# Patient Record
Sex: Male | Born: 1960 | Race: Black or African American | Hispanic: No | Smoking: Current every day smoker
Health system: Southern US, Community
[De-identification: ages and names within clinical notes are randomized; demographics above are authoritative.]

## PROBLEM LIST (undated history)

## (undated) DIAGNOSIS — M4807 Spinal stenosis, lumbosacral region: Secondary | ICD-10-CM

## (undated) DIAGNOSIS — M5430 Sciatica, unspecified side: Secondary | ICD-10-CM

## (undated) HISTORY — PX: OTHER SURGICAL HISTORY: SHX169

---

## 2013-04-20 ENCOUNTER — Emergency Department (HOSPITAL_COMMUNITY): Payer: Non-veteran care

## 2013-04-20 ENCOUNTER — Emergency Department (HOSPITAL_COMMUNITY)
Admission: EM | Admit: 2013-04-20 | Discharge: 2013-04-20 | Disposition: A | Discharge status: Home / Self Care | Payer: Non-veteran care | Attending: Emergency Medicine | Admitting: Emergency Medicine

## 2013-04-20 ENCOUNTER — Encounter (HOSPITAL_COMMUNITY): Payer: Self-pay | Admitting: Emergency Medicine

## 2013-04-20 DIAGNOSIS — M546 Pain in thoracic spine: Secondary | ICD-10-CM | POA: Insufficient documentation

## 2013-04-20 DIAGNOSIS — R071 Chest pain on breathing: Secondary | ICD-10-CM | POA: Insufficient documentation

## 2013-04-20 DIAGNOSIS — G8929 Other chronic pain: Secondary | ICD-10-CM | POA: Insufficient documentation

## 2013-04-20 DIAGNOSIS — F17200 Nicotine dependence, unspecified, uncomplicated: Secondary | ICD-10-CM | POA: Insufficient documentation

## 2013-04-20 HISTORY — DX: Sciatica, unspecified side: M54.30

## 2013-04-20 HISTORY — DX: Spinal stenosis, lumbosacral region: M48.07

## 2013-04-20 LAB — TROPONIN I: Troponin I: <0.3 ng/mL (ref <0.30)

## 2013-04-20 LAB — CBC
MCH: 33.7 pg (ref 26.0–34.0)
MCV: 96.9 fL (ref 78.0–100.0)
Platelets: 207 10*3/uL (ref 150–400)
RBC: 4.81 MIL/uL (ref 4.22–5.81)
RDW: 13.1 % (ref 11.5–15.5)

## 2013-04-20 MED ORDER — OXYCODONE-ACETAMINOPHEN 5-325 MG PO TABS: 1.0000 | ORAL_TABLET | ORAL | Status: DC | PRN

## 2013-04-20 MED ORDER — HYDROMORPHONE HCL PF 1 MG/ML IJ SOLN
1.0000 mg | Freq: Once | INTRAMUSCULAR | Status: AC
  Administered 2013-04-20: 1 mg via INTRAMUSCULAR
  Filled 2013-04-20: qty 1

## 2013-04-20 MED ORDER — CYCLOBENZAPRINE HCL 5 MG PO TABS: 5.0000 mg | ORAL_TABLET | Freq: Three times a day (TID) | ORAL | Status: DC | PRN

## 2013-04-20 MED ORDER — PREDNISONE 10 MG PO TABS: ORAL_TABLET | ORAL | Status: DC

## 2013-04-20 NOTE — ED Provider Notes (Signed)
CSN: [id number]     Arrival date & time 04/20/13  1204 History   First MD Initiated Contact with Patient 04/20/13 1343     Chief Complaint  Patient presents with  . Pleurisy   (Consider location/radiation/quality/duration/timing/severity/associated sxs/prior Treatment) HPI Comments: Ricardo Horn is a 52 y.o. Male presenting with left sided chest and back pain which is a chronic intermittent problem he has had for the past 4 years which is states is related to cervical and upper back "pinched nerve".  He is usually followed for this by his pcp at the [facility] in [place] and has been assessed for possible surgery, but was determined that surgery would not improve his symptoms.  Pain is worse with movement, palpation and deep inspiration, although he denies being short of breath. Additionally he has not had fevers or chills, denies cough, abdominal pain, nausea , vomiting diaphoresis.  He also denies swelling or pain in his extremities. He denies injury.     The history is provided by the patient.    Past Medical History  Diagnosis Date  . Stenosis of lumbosacral spine   . Sciatica    History reviewed. No pertinent past surgical history. No family history on file. History  Substance Use Topics  . Smoking status: Current Every Day Smoker  . Smokeless tobacco: Not on file  . Alcohol Use: No    Review of Systems  Constitutional: Negative for fever, chills and diaphoresis.  HENT: Negative for congestion and sore throat.   Eyes: Negative.   Respiratory: Negative for cough, choking, chest tightness, shortness of breath, wheezing and stridor.   Cardiovascular: Positive for chest pain. Negative for palpitations and leg swelling.  Gastrointestinal: Negative for nausea, vomiting and abdominal pain.  Genitourinary: Negative.   Musculoskeletal: Negative for arthralgias, joint swelling and neck pain.  Skin: Negative.  Negative for rash and wound.  Neurological: Negative for dizziness, weakness,  light-headedness, numbness and headaches.  Psychiatric/Behavioral: Negative.     Allergies  Morphine and related  Home Medications  No current outpatient prescriptions on file. BP 127/78  Pulse 74  Temp(Src) 97.6 F (36.4 C) (Oral)  Resp 20  Ht 6\' 1"  (1.854 m)  Wt 189 lb (85.73 kg)  BMI 24.94 kg/m2  SpO2 99% Physical Exam  Nursing note and vitals reviewed. Constitutional: He appears well-developed and well-nourished.  Appears uncomfortable.  HENT:  Head: Normocephalic and atraumatic.  Eyes: Conjunctivae are normal.  Neck: Normal range of motion.  Cardiovascular: Normal rate, regular rhythm, normal heart sounds and intact distal pulses.   Pulmonary/Chest: Effort normal and breath sounds normal. He has no wheezes. He has no rhonchi. He exhibits tenderness.  Pain is reproducible with palpation of his left lateral chest from mid axillary line to his left posterior upper back.  No muscle spasm noted, no rash,  Swelling, contusion or other sign of trauma.  Abdominal: Soft. Bowel sounds are normal. There is no tenderness.  Musculoskeletal: Normal range of motion. He exhibits no edema and no tenderness.  Neurological: He is alert.  Skin: Skin is warm and dry.  Psychiatric: He has a normal mood and affect.    ED Course  Procedures (including critical care time) Labs Review Labs Reviewed - No data to display Imaging Review Dg Chest 2 View  04/20/2013   CLINICAL DATA:  Severe left-sided chest discomfort for 2 days.  EXAM: CHEST  2 VIEW  COMPARISON:  February 24, 2013.  FINDINGS: The lungs are well-expanded. There is no pneumothorax or pneumomediastinum  nor pleural effusion. There is no alveolar infiltrate. The cardiac silhouette is normal in size. The pulmonary vascularity is not engorged. The mediastinum is normal in width. The observed portions of the bony thorax exhibit no acute abnormalities.  IMPRESSION: There is no evidence of acute cardiopulmonary abnormality. Given the  patient's symptoms, follow-up CT scanning of the chest may be of value.   Electronically Signed   By: [name]   On: 04/20/2013 12:41    EKG Interpretation     Ventricular Rate:  69 PR Interval:  142 QRS Duration: 84 QT Interval:  382 QTC Calculation: 409 R Axis:   34 Text Interpretation:  Normal sinus rhythm Normal ECG No previous ECGs available            MDM  No diagnosis found. Patients labs and/or radiological studies were viewed and considered during the medical decision making and disposition process. Pt was discussed with Dr Adriana Simas prior to dc home.  He was prescribed percocet,  Prednisone taper,  Flexeril.  Encouraged heat therapy.  F/u with pcp at the [facility].  Pt with negative troponin for sx that have been present for 48 + hours,  Normal VS,  Exam not consistent with increased risk for PE.  Reproducible chest wall and back pain.    Burgess Amor, PA-C 04/20/13 1550

## 2013-04-20 NOTE — ED Notes (Signed)
Pt c/o left side chest pain that is worse with palpation, movement, denies any injury,

## 2013-04-20 NOTE — ED Provider Notes (Signed)
Medical screening examination/treatment/procedure(s) were conducted as a shared visit with non-physician practitioner(s) and myself.  I personally evaluated the patient during the encounter.  EKG Interpretation     Ventricular Rate:  69 PR Interval:  142 QRS Duration: 84 QT Interval:  382 QTC Calculation: 409 R Axis:   34 Text Interpretation:  Normal sinus rhythm Normal ECG No previous ECGs available           Chest pain not suggestive of acute coronary syndrome or pulmonary embolism. EKG normal  Donnetta Hutching, MD 04/20/13 [phone]

## 2013-04-20 NOTE — ED Notes (Signed)
Patient with no complaints at this time. Respirations even and unlabored. Skin warm/dry. Discharge instructions reviewed with patient at this time. Patient given opportunity to voice concerns/ask questions. Patient discharged at this time and left Emergency Department with steady gait.   

## 2013-11-29 ENCOUNTER — Emergency Department (HOSPITAL_COMMUNITY)
Admission: EM | Admit: 2013-11-29 | Discharge: 2013-11-29 | Disposition: A | Discharge status: Home / Self Care | Payer: Non-veteran care | Attending: Emergency Medicine | Admitting: Emergency Medicine

## 2013-11-29 ENCOUNTER — Encounter (HOSPITAL_COMMUNITY): Payer: Self-pay | Admitting: Emergency Medicine

## 2013-11-29 ENCOUNTER — Emergency Department (HOSPITAL_COMMUNITY): Payer: Non-veteran care

## 2013-11-29 DIAGNOSIS — M25559 Pain in unspecified hip: Secondary | ICD-10-CM | POA: Insufficient documentation

## 2013-11-29 DIAGNOSIS — Z8739 Personal history of other diseases of the musculoskeletal system and connective tissue: Secondary | ICD-10-CM | POA: Insufficient documentation

## 2013-11-29 DIAGNOSIS — M545 Low back pain, unspecified: Secondary | ICD-10-CM | POA: Insufficient documentation

## 2013-11-29 DIAGNOSIS — F17200 Nicotine dependence, unspecified, uncomplicated: Secondary | ICD-10-CM | POA: Insufficient documentation

## 2013-11-29 DIAGNOSIS — IMO0002 Reserved for concepts with insufficient information to code with codable children: Secondary | ICD-10-CM | POA: Insufficient documentation

## 2013-11-29 DIAGNOSIS — M25552 Pain in left hip: Secondary | ICD-10-CM

## 2013-11-29 DIAGNOSIS — Z79899 Other long term (current) drug therapy: Secondary | ICD-10-CM | POA: Insufficient documentation

## 2013-11-29 LAB — CBC WITH DIFFERENTIAL/PLATELET
Basophils Absolute: 0 10*3/uL (ref 0.0–0.1)
Basophils Relative: 0 % (ref 0–1)
EOS ABS: 0.1 10*3/uL (ref 0.0–0.7)
Eosinophils Relative: 1 % (ref 0–5)
HCT: 44.7 % (ref 39.0–52.0)
HEMOGLOBIN: 15.7 g/dL (ref 13.0–17.0)
Lymphocytes Relative: 22 % (ref 12–46)
Lymphs Abs: 1.8 10*3/uL (ref 0.7–4.0)
MCH: 33.9 pg (ref 26.0–34.0)
MCHC: 35.1 g/dL (ref 30.0–36.0)
MCV: 96.5 fL (ref 78.0–100.0)
MONO ABS: 0.5 10*3/uL (ref 0.1–1.0)
MONOS PCT: 6 % (ref 3–12)
Neutro Abs: 6 10*3/uL (ref 1.7–7.7)
Neutrophils Relative %: 71 % (ref 43–77)
Platelets: 185 10*3/uL (ref 150–400)
RBC: 4.63 MIL/uL (ref 4.22–5.81)
RDW: 13 % (ref 11.5–15.5)
WBC: 8.4 10*3/uL (ref 4.0–10.5)

## 2013-11-29 LAB — URINALYSIS, ROUTINE W REFLEX MICROSCOPIC
BILIRUBIN URINE: NEGATIVE
Glucose, UA: NEGATIVE mg/dL
HGB URINE DIPSTICK: NEGATIVE
Ketones, ur: NEGATIVE mg/dL
Leukocytes, UA: NEGATIVE
Nitrite: NEGATIVE
PROTEIN: NEGATIVE mg/dL
Specific Gravity, Urine: <1.005 — ABNORMAL LOW (ref 1.005–1.030)
UROBILINOGEN UA: 1 mg/dL (ref 0.0–1.0)
pH: 6 (ref 5.0–8.0)

## 2013-11-29 LAB — BASIC METABOLIC PANEL
BUN: 7 mg/dL (ref 6–23)
CO2: 28 mEq/L (ref 19–32)
Calcium: 9.4 mg/dL (ref 8.4–10.5)
Chloride: 102 mEq/L (ref 96–112)
Creatinine, Ser: 1.1 mg/dL (ref 0.50–1.35)
GFR calc Af Amer: 86 mL/min — ABNORMAL LOW (ref >90)
GFR, EST NON AFRICAN AMERICAN: 74 mL/min — AB (ref >90)
GLUCOSE: 91 mg/dL (ref 70–99)
Potassium: 3.8 mEq/L (ref 3.7–5.3)
Sodium: 141 mEq/L (ref 137–147)

## 2013-11-29 MED ORDER — CYCLOBENZAPRINE HCL 10 MG PO TABS: 10.0000 mg | ORAL_TABLET | Freq: Three times a day (TID) | ORAL | Status: DC | PRN

## 2013-11-29 MED ORDER — OXYCODONE-ACETAMINOPHEN 5-325 MG PO TABS: 1.0000 | ORAL_TABLET | ORAL | Status: DC | PRN

## 2013-11-29 MED ORDER — NAPROXEN 500 MG PO TABS: 500.0000 mg | ORAL_TABLET | Freq: Two times a day (BID) | ORAL | Status: DC

## 2013-11-29 MED ORDER — HYDROMORPHONE HCL PF 1 MG/ML IJ SOLN
1.0000 mg | Freq: Once | INTRAMUSCULAR | Status: AC
  Administered 2013-11-29: 1 mg via INTRAMUSCULAR
  Filled 2013-11-29: qty 1

## 2013-11-29 MED ORDER — CYCLOBENZAPRINE HCL 10 MG PO TABS
10.0000 mg | ORAL_TABLET | Freq: Once | ORAL | Status: AC
  Administered 2013-11-29: 10 mg via ORAL
  Filled 2013-11-29: qty 1

## 2013-11-29 MED ORDER — ONDANSETRON 8 MG PO TBDP
8.0000 mg | ORAL_TABLET | Freq: Once | ORAL | Status: AC
  Administered 2013-11-29: 8 mg via ORAL
  Filled 2013-11-29: qty 1

## 2013-11-29 NOTE — Discharge Instructions (Signed)
Hip Pain °The hips join the upper legs to the lower pelvis. The bones, cartilage, tendons, and muscles of the hip joint perform a lot of work each day holding your body weight and allowing you to move around. °Hip pain is a common symptom. It can range from a minor ache to severe pain on 1 or both hips. Pain may be felt on the inside of the hip joint near the groin, or the outside near the buttocks and upper thigh. There may be swelling or stiffness as well. It occurs more often when a person walks or performs activity. There are many reasons hip pain can develop. °CAUSES  °It is important to work with your caregiver to identify the cause since many conditions can impact the bones, cartilage, muscles, and tendons of the hips. Causes for hip pain include: °· Broken (fractured) bones. °· Separation of the thighbone from the hip socket (dislocation). °· Torn cartilage of the hip joint. °· Swelling (inflammation) of a tendon (tendonitis), the sac within the hip joint (bursitis), or a joint. °· A weakening in the abdominal wall (hernia), affecting the nerves to the hip. °· Arthritis in the hip joint or lining of the hip joint. °· Pinched nerves in the back, hip, or upper thigh. °· A bulging disc in the spine (herniated disc). °· Rarely, bone infection or cancer. °DIAGNOSIS  °The location of your hip pain will help your caregiver understand what may be causing the pain. A diagnosis is based on your medical history, your symptoms, results from your physical exam, and results from diagnostic tests. Diagnostic tests may include X-ray exams, a computerized magnetic scan (magnetic resonance imaging, MRI), or bone scan. °TREATMENT  °Treatment will depend on the cause of your hip pain. Treatment may include: °· Limiting activities and resting until symptoms improve. °· Crutches or other walking supports (a cane or brace). °· Ice, elevation, and compression. °· Physical therapy or home exercises. °· Shoe inserts or special  shoes. °· Losing weight. °· Medications to reduce pain. °· Undergoing surgery. °HOME CARE INSTRUCTIONS  °· Only take over-the-counter or prescription medicines for pain, discomfort, or fever as directed by your caregiver. °· Put ice on the injured area: °¨ Put ice in a plastic bag. °¨ Place a towel between your skin and the bag. °¨ Leave the ice on for 15-20 minutes at a time, 03-04 times a day. °· Keep your leg raised (elevated) when possible to lessen swelling. °· Avoid activities that cause pain. °· Follow specific exercises as directed by your caregiver. °· Sleep with a pillow between your legs on your most comfortable side. °· Record how often you have hip pain, the location of the pain, and what it feels like. This information may be helpful to you and your caregiver. °· Ask your caregiver about returning to work or sports and whether you should drive. °· Follow up with your caregiver for further exams, therapy, or testing as directed. °SEEK MEDICAL CARE IF:  °· Your pain or swelling continues or worsens after 1 week. °· You are feeling unwell or have chills. °· You have increasing difficulty with walking. °· You have a loss of sensation or other new symptoms. °· You have questions or concerns. °SEEK IMMEDIATE MEDICAL CARE IF:  °· You cannot put weight on the affected hip. °· You have fallen. °· You have a sudden increase in pain and swelling in your hip. °· You have a fever. °MAKE SURE YOU:  °· Understand these instructions. °·   Will watch your condition. °· Will get help right away if you are not doing well or get worse. °Document Released: 11/14/2009 Document Revised: 08/19/2011 Document Reviewed: 11/14/2009 °ExitCare® Patient Information ©2015 [employer]. This information is not intended to replace advice given to you by your health care provider. Make sure you discuss any questions you have with your health care provider. ° °

## 2013-11-29 NOTE — ED Notes (Signed)
Patient states that he has chronic back pain but for the past 2 days has been having pain to left hip. Patient denies falling

## 2013-11-29 NOTE — ED Provider Notes (Signed)
CSN: [id number]     Arrival date & time 11/29/13  1905 History  This chart was scribed for Pauline Ausammy Triplett, PA, working with Vanetta MuldersScott Zackowski, MD by Chestine SporeSoijett Blue, ED Scribe. The patient was seen in room APFT24/APFT24 at 7:39 PM.     Chief Complaint  Patient presents with  . Hip Pain     The history is provided by the patient. No language interpreter was used.    HPI Comments: Ricardo Horn is a 53 y.o. male who presents to the Emergency Department complaining of sharp intense hip pain onset 3 days ago. He states that he tried to get up 2 days ago and he had to roll up. He states that he has not done anything out of the normal. He states that the pain is exacerbated with movement and standing. He states that when he uses the bathroom his hip will "lock up". He states that he has a shooting pain down his left leg for the past couple of days.  He states that he is not having any issues with urinary or bowel incontinence. He states that he has never hurt this hip before, but does have a hx of chronic lower back pain which he takes vicodin for. He denies vomiting and fever, abd pain, numbness or weakness of the extremities.  He states that he had an epidural 3 weeks ago in his back [facility]  Past Medical History  Diagnosis Date  . Stenosis of lumbosacral spine   . Sciatica    History reviewed. No pertinent past surgical history. History reviewed. No pertinent family history. History  Substance Use Topics  . Smoking status: Current Every Day Smoker -- 0.50 packs/day  . Smokeless tobacco: Not on file  . Alcohol Use: No    Review of Systems  Constitutional: Negative for appetite change and fatigue.  HENT: Negative for congestion, ear discharge and sinus pressure.   Eyes: Negative for discharge.  Respiratory: Negative for cough.   Cardiovascular: Negative for chest pain.  Gastrointestinal: Negative for abdominal pain and diarrhea.  Genitourinary: Negative for  frequency and hematuria.  Musculoskeletal: Positive for back pain (radiating to the lateral left hip).  Skin: Negative for rash.  Neurological: Negative for seizures and headaches.  Psychiatric/Behavioral: Negative for hallucinations.      Allergies  Morphine and related  Home Medications   Prior to Admission medications   Medication Sig Start Date End Date Taking? Authorizing Provider  cyclobenzaprine (FLEXERIL) 5 MG tablet Take 1 tablet (5 mg total) by mouth 3 (three) times daily as needed for muscle spasms. 04/20/13   Burgess AmorJulie Idol, PA-C  oxyCODONE-acetaminophen (PERCOCET/ROXICET) 5-325 MG per tablet Take 1 tablet by mouth every 4 (four) hours as needed for severe pain. 04/20/13   Burgess AmorJulie Idol, PA-C  predniSONE (DELTASONE) 10 MG tablet 6, 5, 4, 3, 2 then 1 tablet by mouth daily for 6 days total. 04/20/13   Burgess AmorJulie Idol, PA-C   BP 166/94  Pulse 89  Temp(Src) 97.3 F (36.3 C) (Oral)  Resp 20  Ht 6\' 1"  (1.854 m)  Wt 189 lb (85.73 kg)  BMI 24.94 kg/m2  SpO2 98%  Physical Exam  Nursing note and vitals reviewed. Constitutional: He is oriented to person, place, and time. He appears well-developed.  HENT:  Head: Normocephalic and atraumatic.  Eyes: Conjunctivae and EOM are normal. No scleral icterus.  Neck: Neck supple. No thyromegaly present.  Cardiovascular: Normal rate, regular rhythm, normal heart sounds and intact  distal pulses.  Exam reveals no gallop and no friction rub.   No murmur heard. Pulmonary/Chest: Effort normal and breath sounds normal. No stridor. No respiratory distress. He has no wheezes. He has no rales. He exhibits no tenderness.  Abdominal: Soft. He exhibits no distension. There is no tenderness. There is no rebound.  Musculoskeletal: Normal range of motion. He exhibits no edema.       Lumbar back: He exhibits tenderness, bony tenderness, pain and spasm. He exhibits no swelling, no deformity and no laceration.       Back:  Localized tenderness to palpitation of  the left lumbar spine, paraspinal muscles and SI joint. Pt describes radiating pain to the lateral left hip. Pain is reproduced with internal and external rotation of the hip.  No erythema or edema. 5/5 strength against resistance bilateral LE. dp pulses are brisk. No calf tenderness.   Lymphadenopathy:    He has no cervical adenopathy.  Neurological: He is alert and oriented to person, place, and time. He exhibits normal muscle tone. Coordination normal.  Skin: No rash noted. No erythema.  Psychiatric: He has a normal mood and affect. His behavior is normal.    ED Course  Procedures (including critical care time) DIAGNOSTIC STUDIES: Oxygen Saturation is 98% on room air, normal by my interpretation.    COORDINATION OF CARE: 7:44 PM-Discussed treatment plan which includes Dilaudid, Zofran, Hip X-ray, and labs with pt at bedside and pt agreed to plan.   Labs Review Labs Reviewed  BASIC METABOLIC PANEL - Abnormal; Notable for the following:    GFR calc non Af Amer 74 (*)    GFR calc Af Amer 86 (*)    All other components within normal limits  URINALYSIS, ROUTINE W REFLEX MICROSCOPIC - Abnormal; Notable for the following:    Specific Gravity, Urine <1.005 (*)    All other components within normal limits  CBC WITH DIFFERENTIAL    Imaging Review Dg Hip Complete Left  11/29/2013   CLINICAL DATA:  Left hip pain.  EXAM: LEFT HIP - COMPLETE 2+ VIEW  COMPARISON:  None.  FINDINGS: There is severe osteoarthritis of the left hip with prominent marginal osteophytes on the acetabulum and femoral head with joint space loss. There is an irregular calcification in the left femoral neck which probably represents a benign bone island.  No acute osseous abnormality.  IMPRESSION: Severe arthritic changes of the left hip.   Electronically Signed   By: Geanie CooleyJim  Maxwell M.D.   On: 11/29/2013 20:40    EKG Interpretation None      MDM   Final diagnoses:  Hip pain, acute, left    Discussed hip film results  with patient and advised him to schedule f/u with orthopedics, referral info given.  Pt agrees to short course of percocet, flexeril and naprosyn for pain.  He agrees to d/c his vicodin while taking the percocet.    He is feeling better after medication and appears stable for d/c.     I personally performed the services described in this documentation, which was scribed in my presence. The recorded information has been reviewed and is accurate.    Tammy L. Trisha Mangleriplett, PA-C 12/02/13 1319

## 2013-12-02 NOTE — ED Provider Notes (Signed)
Medical screening examination/treatment/procedure(s) were performed by non-physician practitioner and as supervising physician I was immediately available for consultation/collaboration.   EKG Interpretation None        Scott Zackowski, MD 12/02/13 1559 

## 2015-07-14 IMAGING — CR DG HIP (WITH OR WITHOUT PELVIS) 2-3V*L*
3 series · 3 of 3 positions shown · non-contrast
Comparison: None.

CLINICAL DATA: Left hip pain.

EXAM:
LEFT HIP - COMPLETE 2+ VIEW

[view not recorded (1 of 3)]
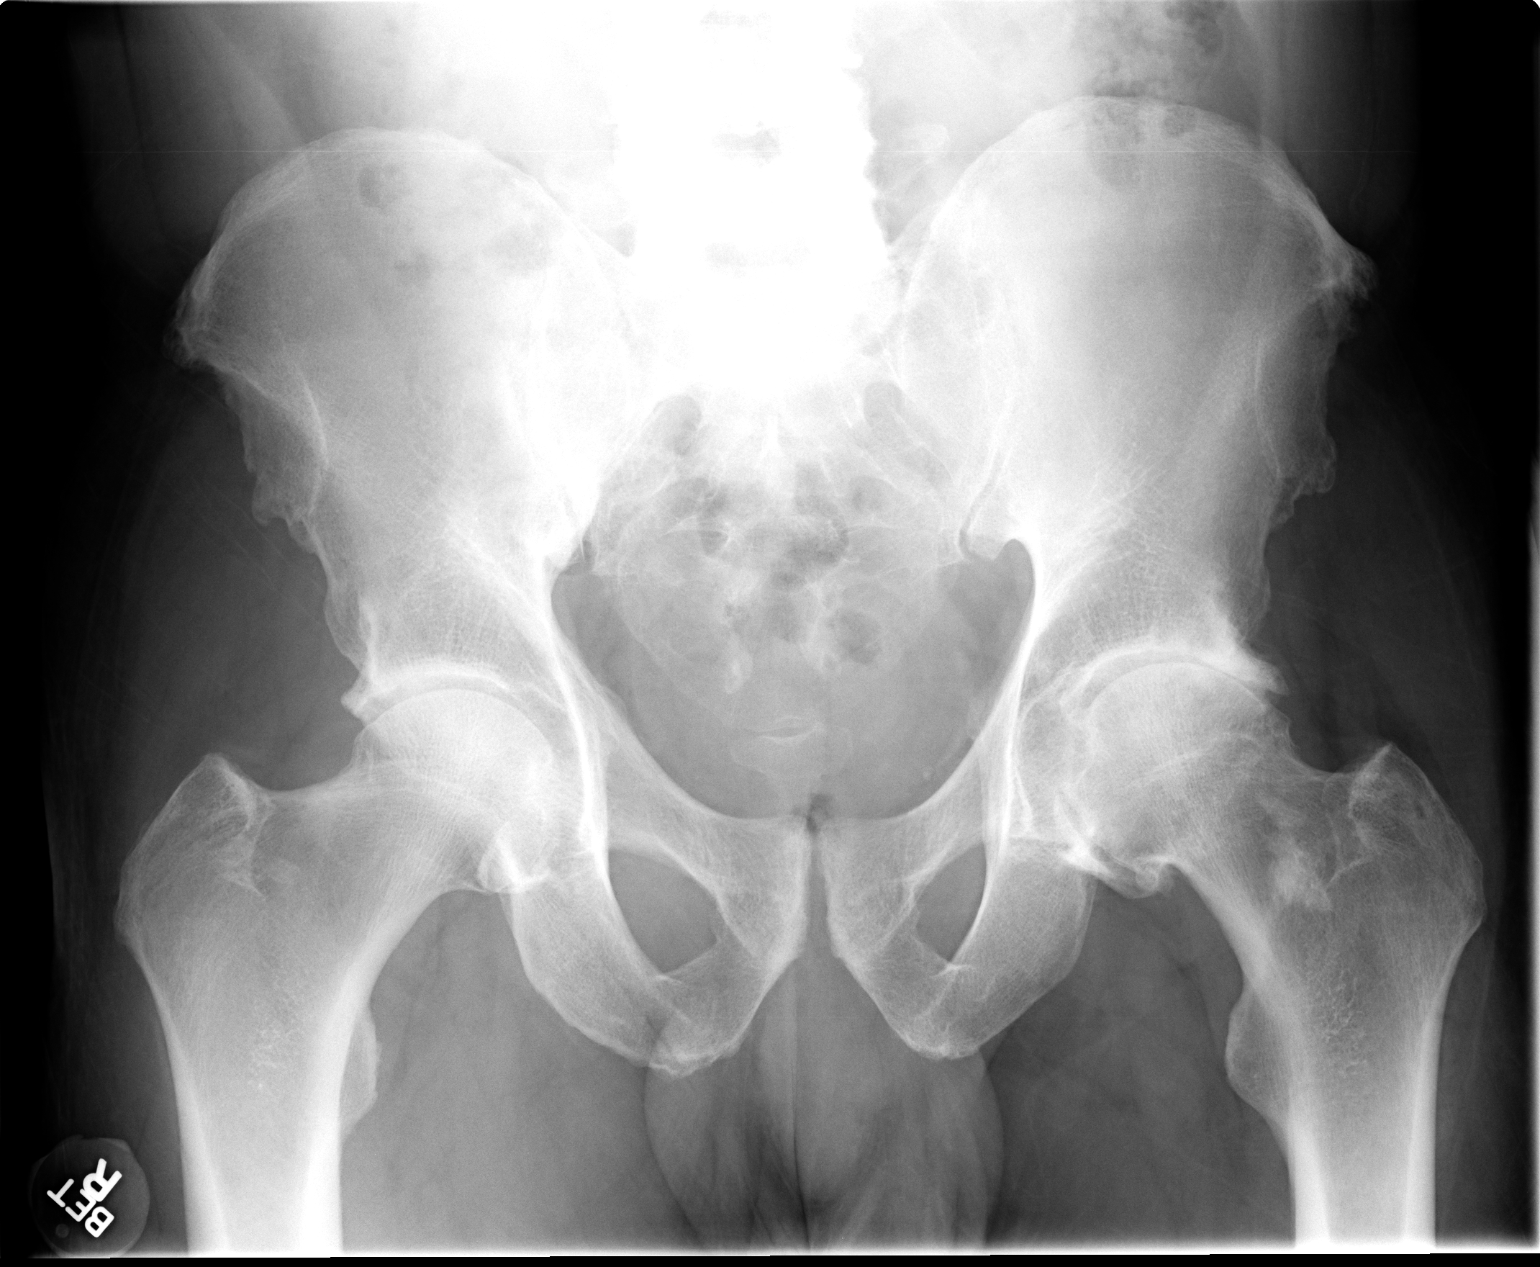

[view not recorded (2 of 3)]
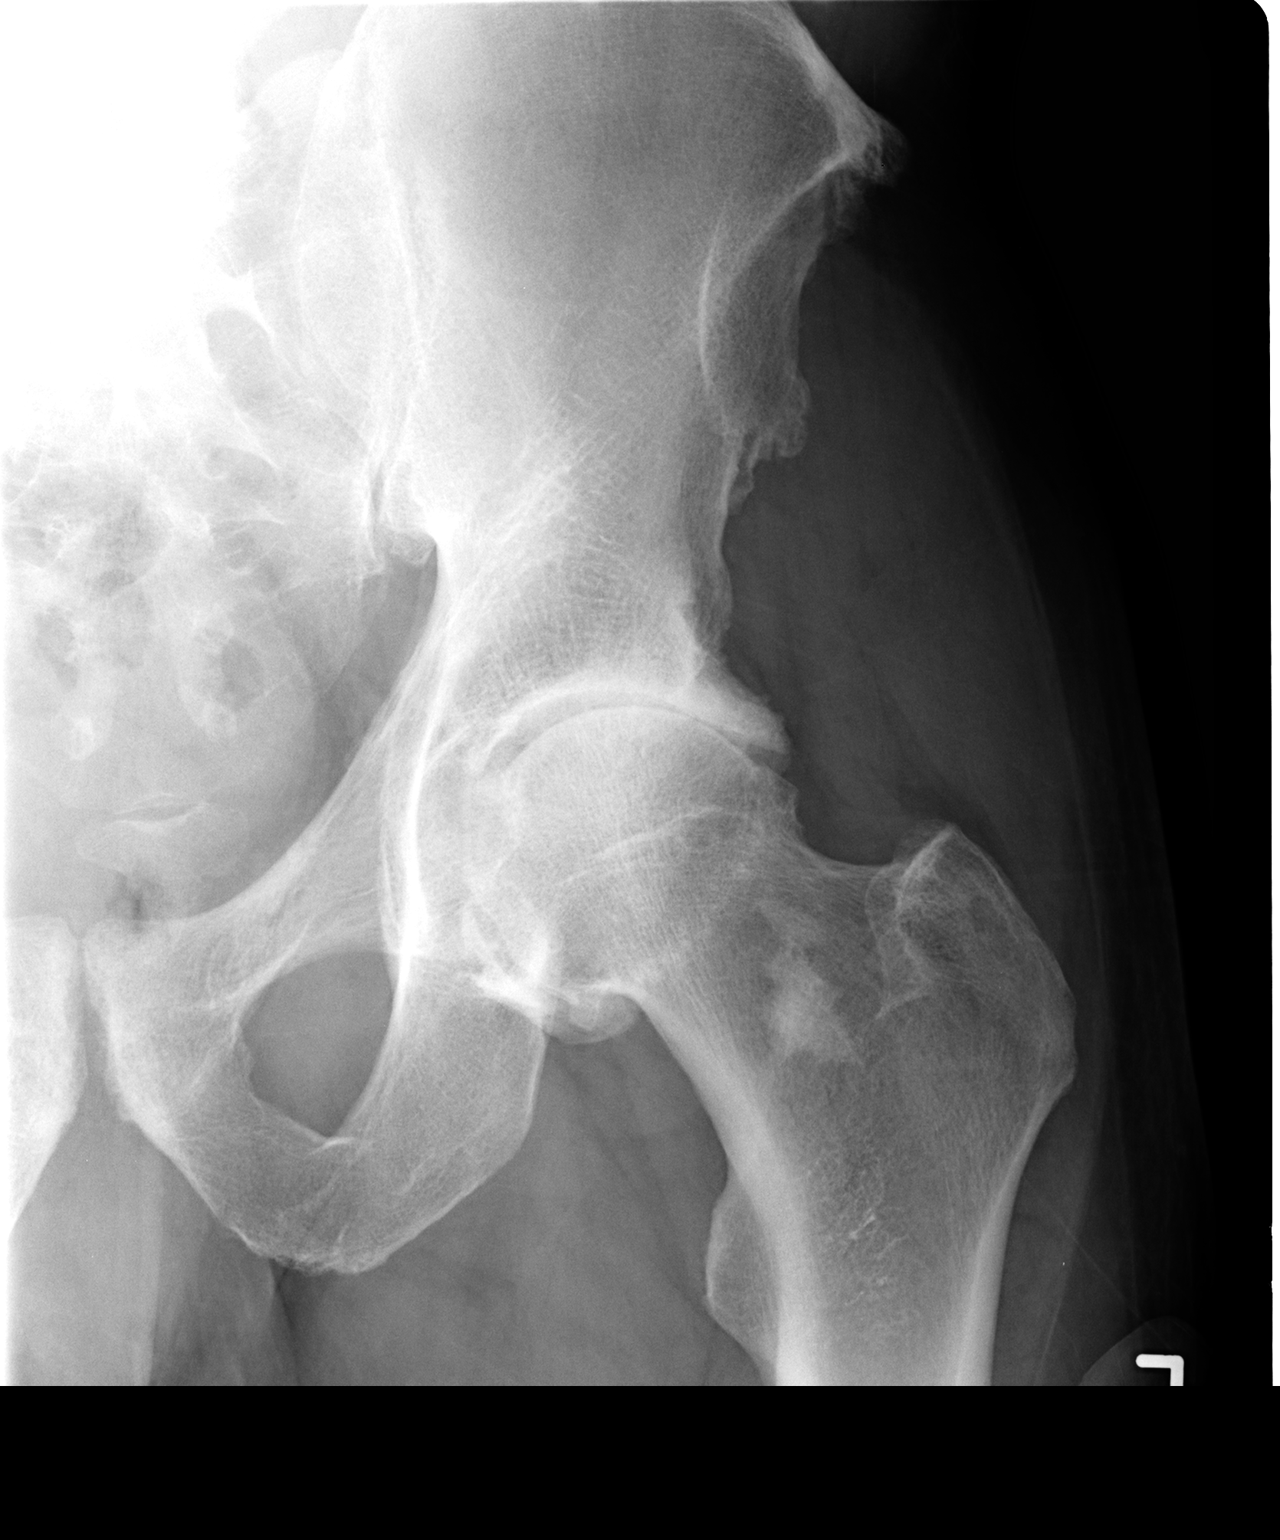

[view not recorded (3 of 3)]
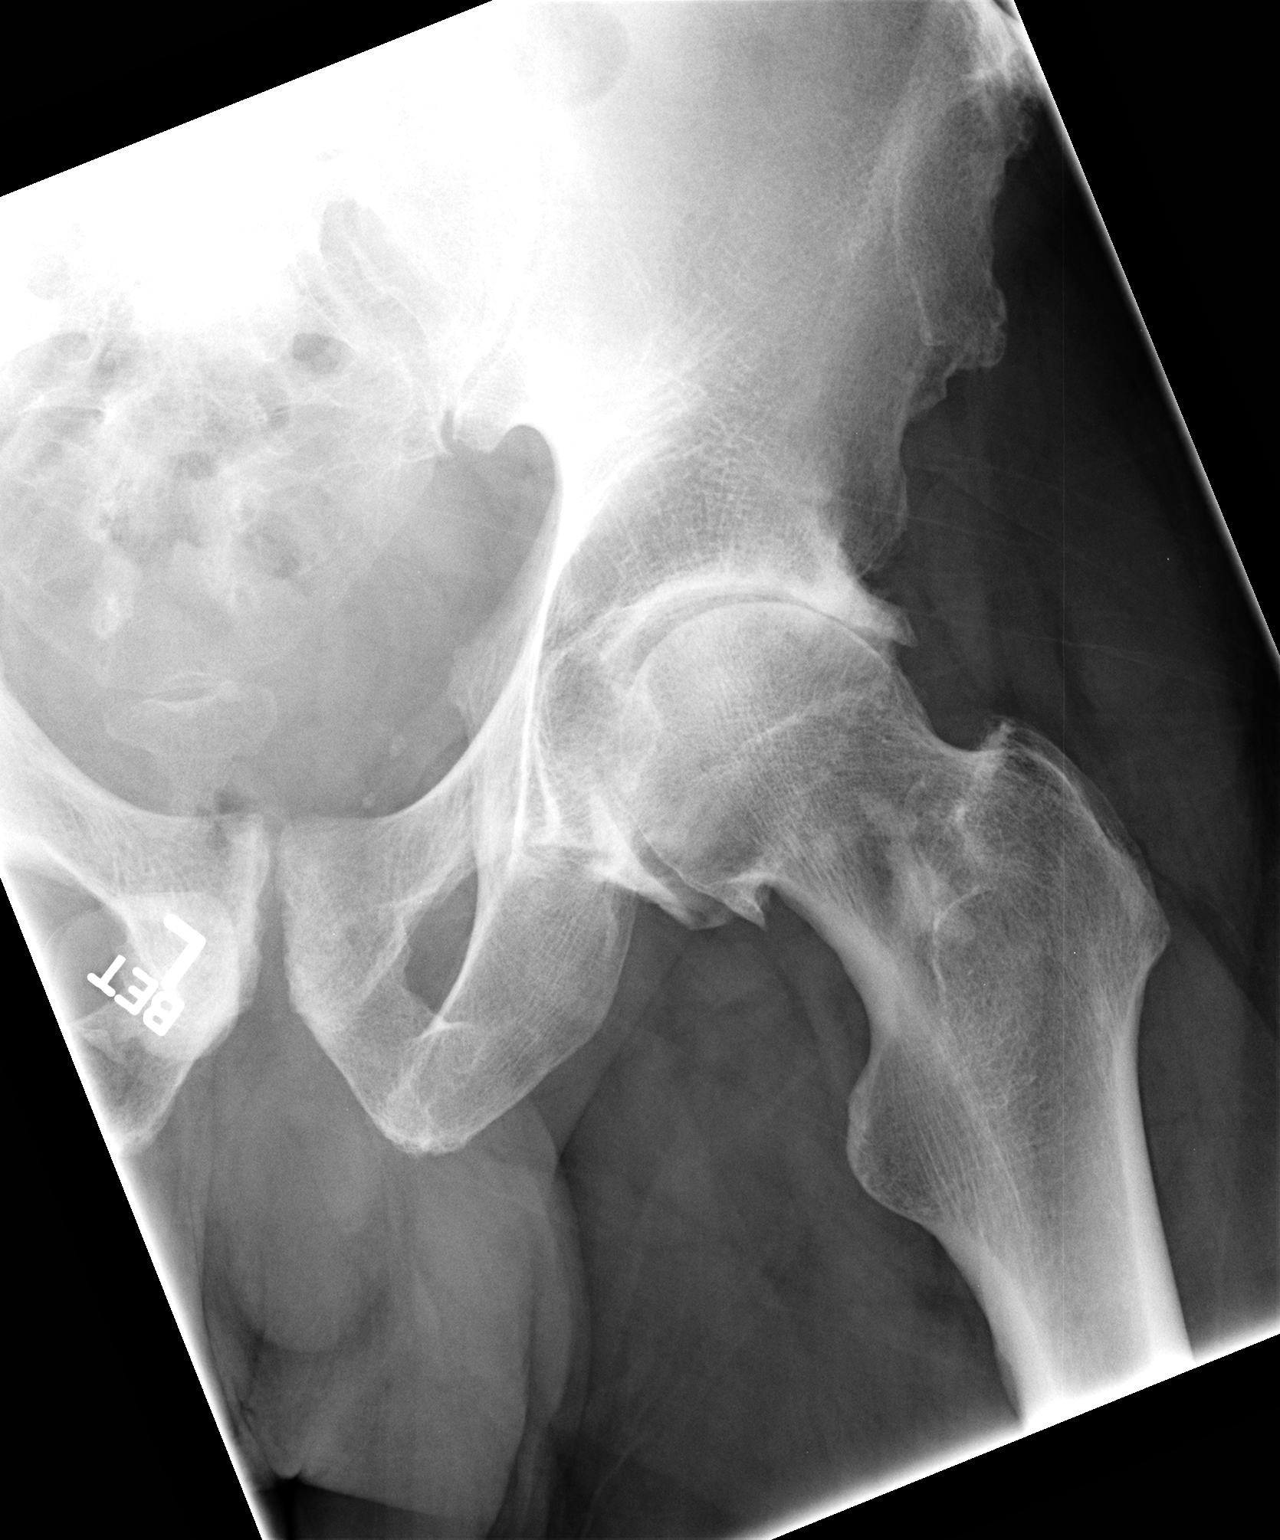

[3 of 3 positions shown; findings below may reference images not displayed]

FINDINGS: There is severe osteoarthritis of the left hip with prominent
marginal osteophytes on the acetabulum and femoral head with joint
space loss. There is an irregular calcification in the left femoral
neck which probably represents a benign bone island.

No acute osseous abnormality.
IMPRESSION: Severe arthritic changes of the left hip.

## 2017-05-20 ENCOUNTER — Emergency Department (HOSPITAL_COMMUNITY)
Admission: EM | Admit: 2017-05-20 | Discharge: 2017-05-20 | Disposition: A | Discharge status: Home / Self Care | Payer: Non-veteran care | Attending: Emergency Medicine | Admitting: Emergency Medicine

## 2017-05-20 ENCOUNTER — Other Ambulatory Visit: Payer: Self-pay

## 2017-05-20 ENCOUNTER — Emergency Department (HOSPITAL_COMMUNITY): Payer: Non-veteran care

## 2017-05-20 ENCOUNTER — Encounter (HOSPITAL_COMMUNITY): Payer: Self-pay | Admitting: Emergency Medicine

## 2017-05-20 DIAGNOSIS — F17210 Nicotine dependence, cigarettes, uncomplicated: Secondary | ICD-10-CM | POA: Insufficient documentation

## 2017-05-20 DIAGNOSIS — Z96649 Presence of unspecified artificial hip joint: Secondary | ICD-10-CM | POA: Insufficient documentation

## 2017-05-20 DIAGNOSIS — M25511 Pain in right shoulder: Secondary | ICD-10-CM | POA: Diagnosis not present

## 2017-05-20 MED ORDER — IBUPROFEN 600 MG PO TABS: 600.0000 mg | ORAL_TABLET | Freq: Four times a day (QID) | ORAL | 0 refills | Status: AC | PRN

## 2017-05-20 MED ORDER — HYDROCODONE-ACETAMINOPHEN 5-325 MG PO TABS: 2.0000 | ORAL_TABLET | ORAL | 0 refills | Status: AC | PRN

## 2017-05-20 NOTE — Discharge Instructions (Signed)
[facility]

## 2017-05-20 NOTE — ED Provider Notes (Signed)
[facility] EMERGENCY DEPARTMENT Provider Note   CSN: [id number] Arrival date & time: 05/20/17  1639     History   Chief Complaint Chief Complaint  Patient presents with  . Shoulder Pain    HPI Ricardo Horn is a 56 y.o. male.  The history is provided by the patient. No language interpreter was used.  Shoulder Pain   This is a recurrent problem. The current episode started yesterday. The problem occurs constantly. The problem has been gradually worsening. The pain is present in the right shoulder. The quality of the pain is described as aching. The pain is moderate. Associated symptoms include limited range of motion. He has tried nothing for the symptoms. The treatment provided no relief.   Pt reports he fell yesterday.  Pt reports he has had some pain on and off for 2 years.   Past Medical History:  Diagnosis Date  . Sciatica   . Stenosis of lumbosacral spine     There are no active problems to display for this patient.   Past Surgical History:  Procedure Laterality Date  . hip replacement         Home Medications    Prior to Admission medications   Medication Sig Start Date End Date Taking? Authorizing Provider  HYDROcodone-acetaminophen (NORCO/VICODIN) 5-325 MG tablet Take 2 tablets by mouth every 4 (four) hours as needed. 05/20/17   Elson AreasSofia, Hershy Flenner K, PA-C  ibuprofen (ADVIL,MOTRIN) 600 MG tablet Take 1 tablet (600 mg total) by mouth every 6 (six) hours as needed. 05/20/17   Elson AreasSofia, Kalila Adkison K, PA-C    Family History History reviewed. No pertinent family history.  Social History Social History   Tobacco Use  . Smoking status: Current Every Day Smoker    Packs/day: 0.50  . Smokeless tobacco: Never Used  Substance Use Topics  . Alcohol use: Yes    Comment: beer daily  . Drug use: No     Allergies   Morphine and related   Review of Systems Review of Systems  All other systems reviewed and are negative.    Physical Exam Updated Vital  Signs BP (!) 146/97 (BP Location: Left Arm)   Pulse 66   Temp 97.7 F (36.5 C) (Oral)   Resp 16   Wt 86.2 kg (190 lb)   SpO2 98%   BMI 25.07 kg/m   Physical Exam  Constitutional: He appears well-developed and well-nourished.  HENT:  Head: Normocephalic.  Musculoskeletal: He exhibits tenderness.  Tender right shoulder, pain with range of motion, nv and ns intact   Neurological: He is alert.  Skin: Skin is warm.  Psychiatric: He has a normal mood and affect.  Vitals reviewed.    ED Treatments / Results  Labs (all labs ordered are listed, but only abnormal results are displayed) Labs Reviewed - No data to display  EKG  EKG Interpretation None       Radiology Dg Shoulder Right  Result Date: 05/20/2017 CLINICAL DATA:  Right shoulder pain after fall yesterday. EXAM: RIGHT SHOULDER - 2+ VIEW COMPARISON:  None. FINDINGS: There is no evidence of fracture or dislocation. There is no evidence of arthropathy. Mild acromial spurring is noted. Soft tissues are unremarkable. IMPRESSION: No fracture or dislocation is noted. Mild acromial spurring is noted. Electronically Signed   By: Lupita RaiderJames  Green Jr, M.D.   On: 05/20/2017 17:18    Procedures Procedures (including critical care time)  Medications Ordered in ED Medications - No data to display   Initial Impression /  Assessment and Plan / ED Course  I have reviewed the triage vital signs and the nursing notes.  Pertinent labs & imaging results that were available during my care of the patient were reviewed by me and considered in my medical decision making (see chart for details).     [facility].  Pt advised to see his Orthopaedsit. Pt placed in a sling.    An After Visit Summary was printed and given to the patient.  Final Clinical Impressions(s) / ED Diagnoses   Final diagnoses:  Acute pain of right shoulder    ED Discharge Orders        Ordered    ibuprofen (ADVIL,MOTRIN) 600 MG tablet   Every 6 hours PRN     05/20/17 1736    HYDROcodone-acetaminophen (NORCO/VICODIN) 5-325 MG tablet  Every 4 hours PRN     05/20/17 1736       Osie CheeksSofia, Javien Tesch K, PA-C 05/20/17 2007    Maia PlanLong, Joshua G, MD 05/21/17 [phone]

## 2017-05-20 NOTE — ED Triage Notes (Signed)
Pt fell yesterday and c/o right shoulder pain. rom limited due to pain. Radial pulses present. No obvious deformity noted.

## 2018-07-13 IMAGING — DX DG SHOULDER 2+V*R*
2 series · 2 of 2 positions shown · non-contrast
Comparison: None.

CLINICAL DATA: Right shoulder pain after fall yesterday.

EXAM:
RIGHT SHOULDER - 2+ VIEW

[shoulder grashey]
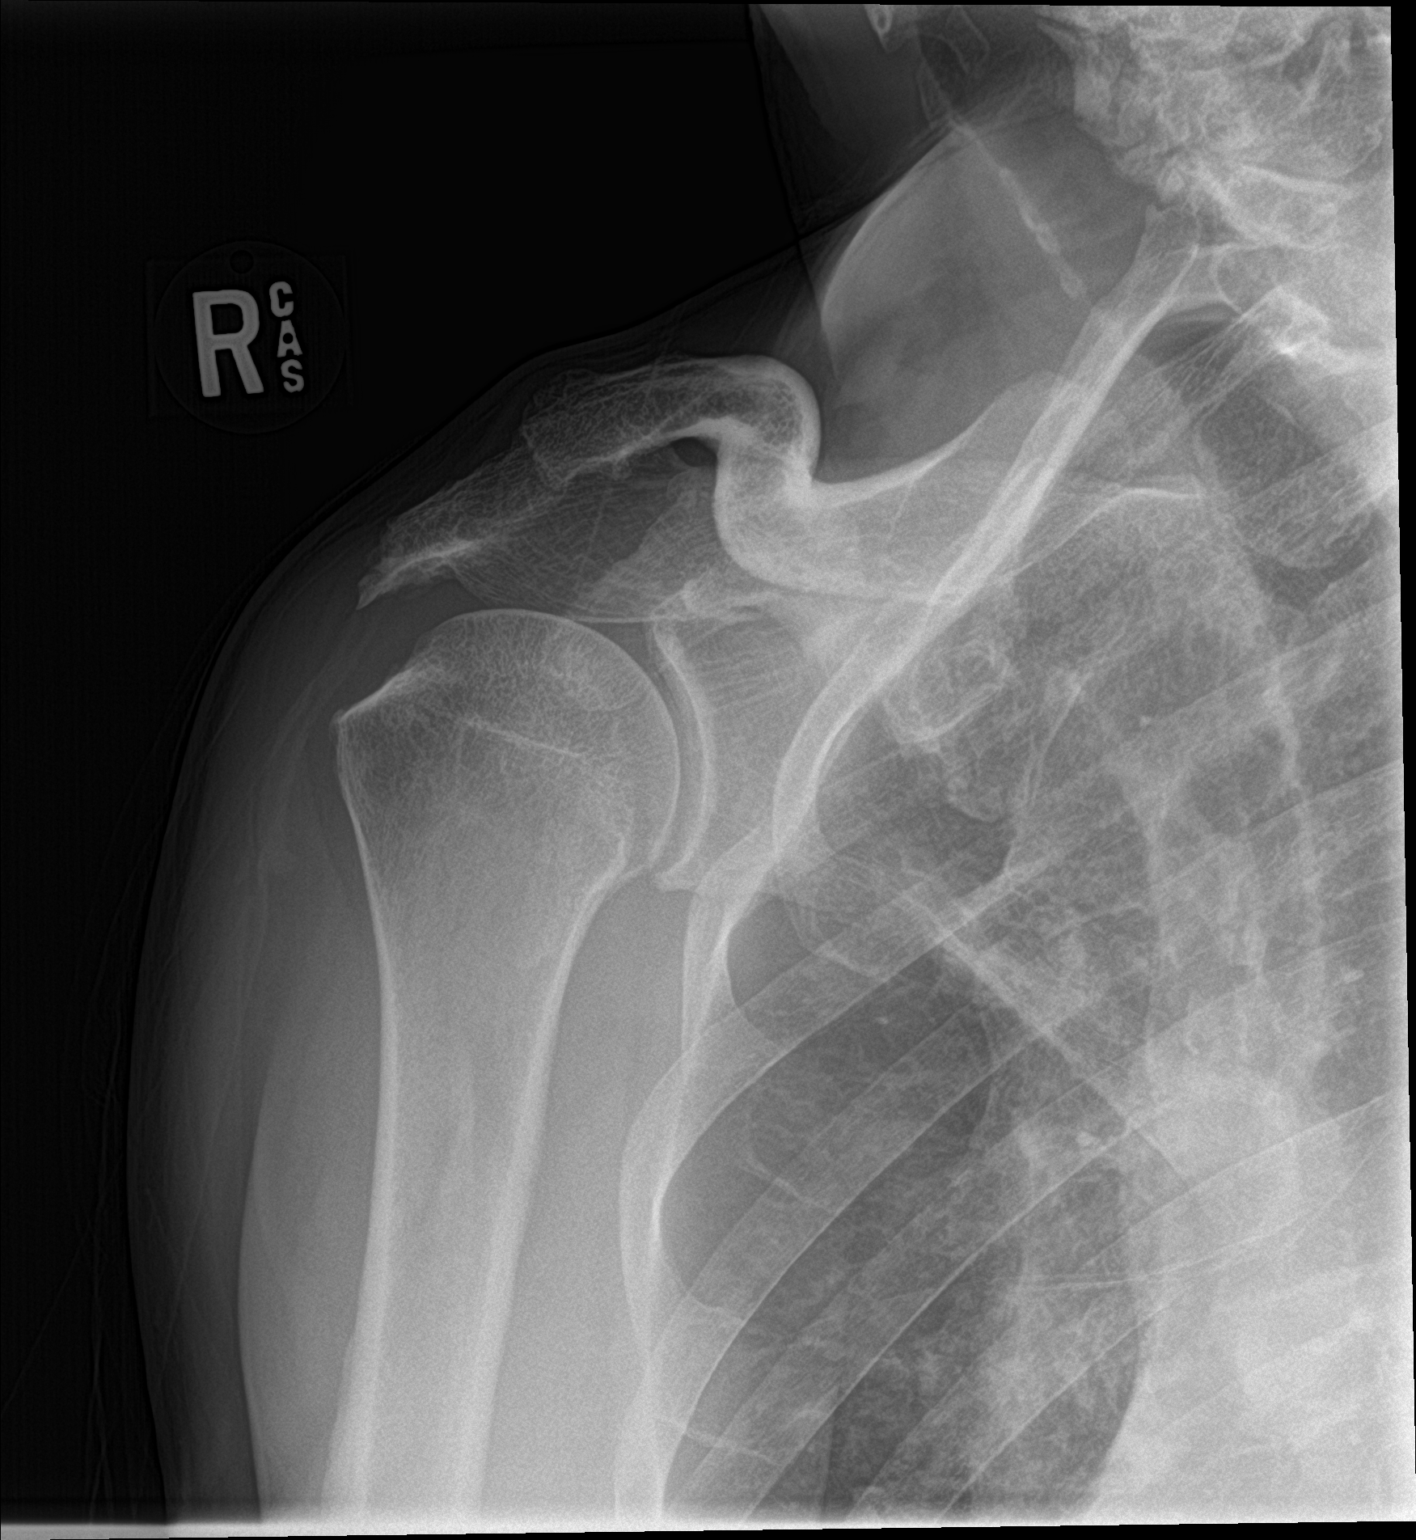

[shoulder y view]
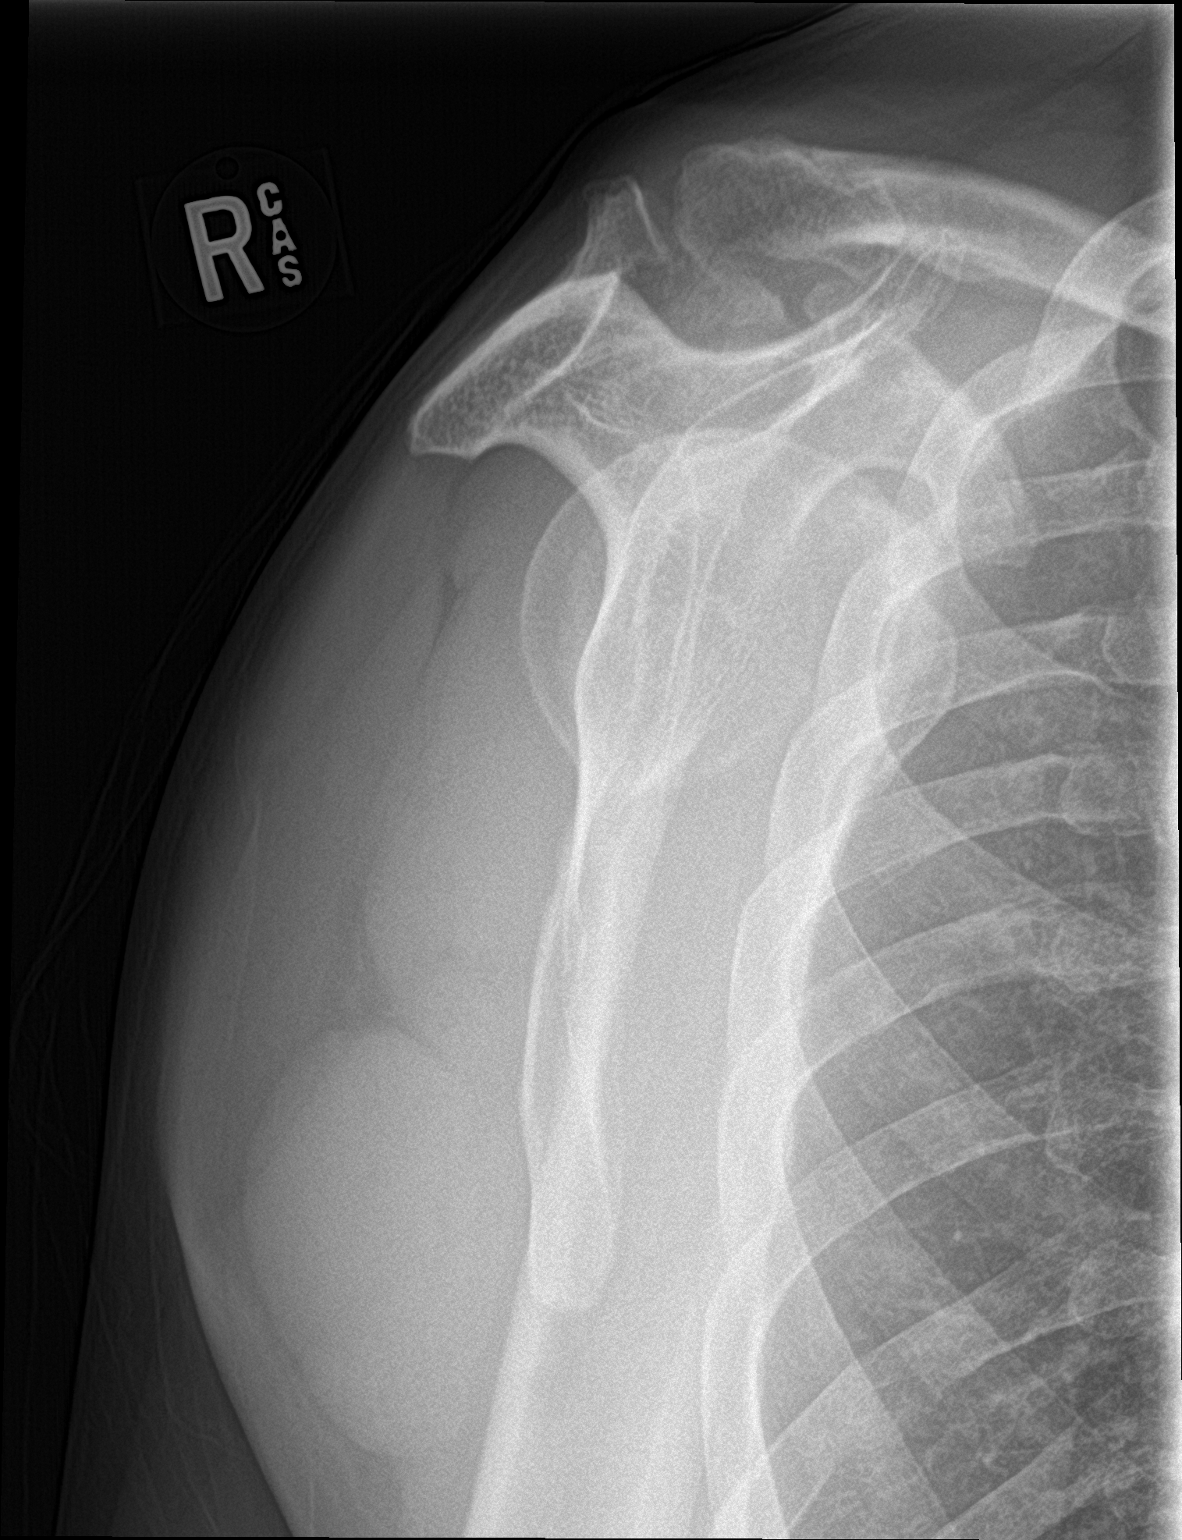

[2 of 2 positions shown; findings below may reference images not displayed]

FINDINGS: There is no evidence of fracture or dislocation. There is no
evidence of arthropathy. Mild acromial spurring is noted. Soft
tissues are unremarkable.
IMPRESSION: No fracture or dislocation is noted. Mild acromial spurring is
noted.

## 2022-09-14 ENCOUNTER — Emergency Department (HOSPITAL_COMMUNITY): Payer: No Typology Code available for payment source

## 2022-09-14 ENCOUNTER — Emergency Department (HOSPITAL_COMMUNITY)
Admission: EM | Admit: 2022-09-14 | Discharge: 2022-09-14 | Disposition: A | Discharge status: Home / Self Care | Payer: No Typology Code available for payment source | Attending: Emergency Medicine | Admitting: Emergency Medicine

## 2022-09-14 ENCOUNTER — Other Ambulatory Visit: Payer: Self-pay

## 2022-09-14 ENCOUNTER — Encounter (HOSPITAL_COMMUNITY): Payer: Self-pay

## 2022-09-14 DIAGNOSIS — J029 Acute pharyngitis, unspecified: Secondary | ICD-10-CM | POA: Diagnosis present

## 2022-09-14 DIAGNOSIS — J039 Acute tonsillitis, unspecified: Secondary | ICD-10-CM | POA: Diagnosis not present

## 2022-09-14 DIAGNOSIS — Z1152 Encounter for screening for COVID-19: Secondary | ICD-10-CM | POA: Diagnosis not present

## 2022-09-14 LAB — RESP PANEL BY RT-PCR (RSV, FLU A&B, COVID)  RVPGX2
Influenza A by PCR: NEGATIVE
Influenza B by PCR: NEGATIVE
Resp Syncytial Virus by PCR: NEGATIVE
SARS Coronavirus 2 by RT PCR: NEGATIVE

## 2022-09-14 LAB — I-STAT CHEM 8, ED
BUN: 12 mg/dL (ref 8–23)
Calcium, Ion: 1.21 mmol/L (ref 1.15–1.40)
Chloride: 99 mmol/L (ref 98–111)
Creatinine, Ser: 1.1 mg/dL (ref 0.61–1.24)
Glucose, Bld: 102 mg/dL — ABNORMAL HIGH (ref 70–99)
HCT: 47 % (ref 39.0–52.0)
Hemoglobin: 16 g/dL (ref 13.0–17.0)
Potassium: 4.1 mmol/L (ref 3.5–5.1)
Sodium: 137 mmol/L (ref 135–145)
TCO2: 30 mmol/L (ref 22–32)

## 2022-09-14 LAB — GROUP A STREP BY PCR: Group A Strep by PCR: NOT DETECTED

## 2022-09-14 MED ORDER — AMOXICILLIN-POT CLAVULANATE 875-125 MG PO TABS: 1.0000 | ORAL_TABLET | Freq: Two times a day (BID) | ORAL | 0 refills | Status: AC

## 2022-09-14 MED ORDER — OXYCODONE-ACETAMINOPHEN 5-325 MG PO TABS
2.0000 | ORAL_TABLET | Freq: Once | ORAL | Status: AC
  Administered 2022-09-14: 2 via ORAL
  Filled 2022-09-14: qty 2

## 2022-09-14 MED ORDER — IOHEXOL 300 MG/ML  SOLN
75.0000 mL | Freq: Once | INTRAMUSCULAR | Status: AC | PRN
  Administered 2022-09-14: 75 mL via INTRAVENOUS

## 2022-09-14 MED ORDER — AMOXICILLIN-POT CLAVULANATE 875-125 MG PO TABS
1.0000 | ORAL_TABLET | Freq: Once | ORAL | Status: AC
  Administered 2022-09-14: 1 via ORAL
  Filled 2022-09-14: qty 1

## 2022-09-14 MED ORDER — DEXAMETHASONE SODIUM PHOSPHATE 10 MG/ML IJ SOLN
10.0000 mg | Freq: Once | INTRAMUSCULAR | Status: AC
  Administered 2022-09-14: 10 mg via INTRAVENOUS
  Filled 2022-09-14: qty 1

## 2022-09-14 NOTE — ED Provider Notes (Signed)
[place] [facility] AT [facility] Provider Note   CSN: [id number] Arrival date & time: 09/14/22  0045     History  Chief Complaint  Patient presents with   Sore Throat    Ricardo BrashGarrett Fratto is a 62 y.o. male.  62 year old male that presents the ER today with sore throat, left-sided facial pain.  Patient states [facility] couple [facility] ago and they told him was probably sinusitis.  They told to take Mucinex and ibuprofen.  He states he went home and could not sleep.  Said the pain got worse.  Has trouble opening his mouth.  Subjective fevers at home.  No nausea or vomiting.   Sore Throat       Home Medications Prior to Admission medications   Medication Sig Start Date End Date Taking? Authorizing Provider  amoxicillin-clavulanate (AUGMENTIN) 875-125 MG tablet Take 1 tablet by mouth every 12 (twelve) hours. 09/14/22  Yes Erland Vivas, Barbara CowerJason, MD  HYDROcodone-acetaminophen (NORCO/VICODIN) 5-325 MG tablet Take 2 tablets by mouth every 4 (four) hours as needed. 05/20/17   Elson AreasSofia, Leslie K, PA-C  ibuprofen (ADVIL,MOTRIN) 600 MG tablet Take 1 tablet (600 mg total) by mouth every 6 (six) hours as needed. 05/20/17   Elson AreasSofia, Leslie K, PA-C      Allergies    Morphine and related    Review of Systems   Review of Systems  Physical Exam Updated Vital Signs BP (!) 150/100   Pulse 75   Temp 100.1 F (37.8 C)   Resp 17   SpO2 97%  Physical Exam Vitals and nursing note reviewed.  Constitutional:      Appearance: He is well-developed.  HENT:     Head: Normocephalic and atraumatic.     Comments: Trismus and pain over the left parotid.  Pain with movement of his left ear. Cardiovascular:     Rate and Rhythm: Normal rate.  Pulmonary:     Effort: Pulmonary effort is normal. No respiratory distress.  Abdominal:     General: There is no distension.  Musculoskeletal:        General: Normal range of motion.     Cervical back: Normal range of motion.   Neurological:     Mental Status: He is alert.     ED Results / Procedures / Treatments   Labs (all labs ordered are listed, but only abnormal results are displayed) Labs Reviewed  I-STAT CHEM 8, ED - Abnormal; Notable for the following components:      Result Value   Glucose, Bld 102 (*)    All other components within normal limits  GROUP A STREP BY PCR  RESP PANEL BY RT-PCR (RSV, FLU A&B, COVID)  [id number]    EKG None  Radiology CT Maxillofacial W Contrast  Result Date: 09/14/2022 CLINICAL DATA:  62 year old male with sore throat, difficulty swallowing, pain radiating to the left ear. Fever, chills. EXAM: CT MAXILLOFACIAL WITH CONTRAST TECHNIQUE: Multidetector CT imaging of the maxillofacial structures was performed with intravenous contrast. Multiplanar CT image reconstructions were also generated. RADIATION DOSE REDUCTION: This exam was performed according to the departmental dose-optimization program which includes automated exposure control, adjustment of the mA and/or kV according to patient size and/or use of iterative reconstruction technique. CONTRAST:  75mL OMNIPAQUE IOHEXOL 300 MG/ML  SOLN COMPARISON:  None Available. FINDINGS: Osseous: Mandible intact and normally located. Carious bilateral mandible molars. Bilateral maxilla, zygoma, nasal bones appear intact. Chronic left zygomatic arch fracture, right zygoma intact. No acute  maxillary dental finding. Central skull base appears intact. 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 Visible calvarium intact. Orbits: No orbital wall fracture. Globes and intraorbital soft tissues appear normal. No convincing periorbital soft tissue inflammation. Sinuses: Paranasal sinuses are generally well aerated. No sinus fluid levels identified. There is mild circumferential mucosal thickening in the right maxillary sinus. Left maxillary mucosal  thickening primarily at the alveolar recess. Scattered mild ethmoid mucosal thickening. Petrous apex air cells, tympanic cavities, visible mastoids are clear. Soft tissues: Retained secretions in the hypopharynx. Otherwise the larynx, epiglottis remain normal (sagittal image 48). Lobulated and enhancing bilateral lingual and palatine tonsils. Right palatine tonsil small circumscribed crescent-shaped fluid collection measuring about 8 mm (series 2, image 54) might be fluid trapped within a tonsillar crypt rather than suppuration. And there are similar small crescent-shaped low-density areas in the left palatine tonsil on image 60. No obvious parapharyngeal space inflammation to strongly suggest a tonsillar abscess. Adenoids contour is more normal. No retropharyngeal abscess or significant edema. Sublingual, submandibular, masticator, and parotid spaces are within normal limits. Visible major vascular structures in the neck and at the skull base remain patent. Left greater than right carotid bifurcation atherosclerosis. Mildly enlarged, enhancing, reactive appearing level 2 and level 3 cervical lymph nodes are more pronounced on the left (13 mm left level 2A node series 2, image 64). No cystic or necrotic lymph node. Limited intracranial: Negative. IMPRESSION: 1. Evidence of Acute Tonsillitis, affecting the bilateral lingual and palatine tonsils. Reactive cervical lymphadenopathy. There are subcentimeter crescent shaped fluid foci within Both palatine tonsils, which at this time seem more likely to be fluid trapped within tonsillar crypts instead of small tonsillar abscesses. And there is no obvious parapharyngeal space inflammation. Recommend antibiotic treatment, and repeat Face Or Neck CT with IV contrast if the patient fails to improve. 2. Carious bilateral mandible molars. No acute osseous abnormality identified. Chronic left zygomatic arch fracture. Cervical spine DISH. Electronically Signed   By: Odessa Fleming M.D.    On: 09/14/2022 04:45    Procedures Procedures    Medications Ordered in ED Medications  dexamethasone (DECADRON) injection 10 mg (has no administration in time range)  oxyCODONE-acetaminophen (PERCOCET/ROXICET) 5-325 MG per tablet 2 tablet (2 tablets Oral Given 09/14/22 0340)  amoxicillin-clavulanate (AUGMENTIN) 875-125 MG per tablet 1 tablet (1 tablet Oral Given 09/14/22 0340)  iohexol (OMNIPAQUE) 300 MG/ML solution 75 mL (75 mLs Intravenous Contrast Given 09/14/22 0422)    ED Course/ Medical Decision Making/ A&P                             Medical Decision Making Amount and/or Complexity of Data Reviewed Radiology: ordered.  Risk Prescription drug management.  Secondary to the trismus and parotid pain along with negative COVID/strep and not responding to home treatments we will get a CT scan to evaluate for any deep neck space infections.  Augmentin given and Percocet given as well. CT with tonsillitis and reactive changes, no ovious abscesses. Antibiotics already started. Appears well. Will return for any worsening, pcp if not improving.   Final Clinical Impression(s) / ED Diagnoses Final diagnoses:  Tonsillitis    Rx / DC Orders ED Discharge Orders          Ordered    amoxicillin-clavulanate (AUGMENTIN) 875-125 MG tablet  Every 12 hours        09/14/22 0458  Obrian Bulson, Barbara Cower, MD 09/14/22 [id number]

## 2022-09-14 NOTE — ED Triage Notes (Signed)
Pt presents with sore throat x3 days. States that he hard to swallows and radiates to left ear. Endorses fevers and chills.
